# Patient Record
Sex: Male | Born: 1970 | Race: White | Hispanic: No | Marital: Single | Smoking: Never smoker
Health system: Southern US, Community
[De-identification: ages and names within clinical notes are randomized; demographics above are authoritative.]

## PROBLEM LIST (undated history)

## (undated) DIAGNOSIS — T7840XA Allergy, unspecified, initial encounter: Secondary | ICD-10-CM

## (undated) HISTORY — DX: Allergy, unspecified, initial encounter: T78.40XA

---

## 2015-04-21 ENCOUNTER — Ambulatory Visit (INDEPENDENT_AMBULATORY_CARE_PROVIDER_SITE_OTHER): Payer: BC Managed Care – PPO | Admitting: Family Medicine

## 2015-04-21 VITALS — BP 108/68 | HR 73 | Temp 98.8°F | Resp 18 | Ht 69.25 in | Wt 259.4 lb

## 2015-04-21 DIAGNOSIS — J309 Allergic rhinitis, unspecified: Secondary | ICD-10-CM | POA: Diagnosis not present

## 2015-04-21 DIAGNOSIS — J4520 Mild intermittent asthma, uncomplicated: Secondary | ICD-10-CM

## 2015-04-21 MED ORDER — BENZONATATE 100 MG PO CAPS: 100.0000 mg | ORAL_CAPSULE | Freq: Three times a day (TID) | ORAL | Status: AC | PRN

## 2015-04-21 MED ORDER — HYDROCODONE-HOMATROPINE 5-1.5 MG/5ML PO SYRP
5.0000 mL | ORAL_SOLUTION | ORAL | Status: AC | PRN
Start: 2015-04-21 — End: ?

## 2015-04-21 MED ORDER — FLUTICASONE PROPIONATE 50 MCG/ACT NA SUSP: 2.0000 | Freq: Every day | NASAL | Status: AC

## 2015-04-21 NOTE — Patient Instructions (Signed)
Always drink plenty of fluids because that will help keep the secretions thinner  Use the fluticasone nose spray 2 sprays in each nostril twice daily for about 4 days, then drop back to once daily.  Take the benzonatate cough pills one pill or 2 pills 3 times daily as needed for daytime cough  Use the Hycodan cough syrup at bedtime if needed for nighttime cough  Take an over-the-counter anti-histamine such as Claritin (loratadine) or fexofenadine (Allegra) or cetirizine (Zyrtec) one daily. If the fluticasone is not helping the nasal congestion enough and the hip pressure sensation you can try one of these allergy medications in the fashion that combines them with a decongestant (-D)  If you start bringing up a lot of purulent mucus contact me or return if needed

## 2015-04-21 NOTE — Progress Notes (Signed)
  Subjective:  Patient ID: Scott Prince, male    DOB: [birth year]  Age: 44 y.o. 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 He is bending [employer] for 2 years. When he was in [place] with his last job he did have some laboratory allergy problems. For the last several weeks he has been having headache congestion and pressure in his sinuses. He has a cough day and night. He is coughing up some white phlegm.  This is his first visit here. He is generally healthy. Not on any regular medications. He is taken some DM cough syrup.    Objective:   Pleasant gentleman, a little overweight, in no acute distress. He has a little rash on both cheeks adjacent to the nose. This is just been there over the last week. TMs are normal. Throat clear. Sinuses don't seem to be particularly tender. Neck supple without significant nodes. Chest is clear to auscultation. Heart regular without murmur.  Assessment & Plan:   Assessment: Allergic rhinosinusitis Allergic bronchitis  Plan: Patient Instructions  Always drink plenty of fluids because that will help keep the secretions thinner  Use the fluticasone nose spray 2 sprays in each nostril twice daily for about 4 days, then drop back to once daily.  Take the benzonatate cough pills one pill or 2 pills 3 times daily as needed for daytime cough  Use the Hycodan cough syrup at bedtime if needed for nighttime cough  Take an over-the-counter anti-histamine such as Claritin (loratadine) or fexofenadine (Allegra) or cetirizine (Zyrtec) one daily. If the fluticasone is not helping the nasal congestion enough and the hip pressure sensation you can try one of these allergy medications in the fashion that combines them with a decongestant (-D)  If you start bringing up a lot of purulent mucus contact me or return if needed     Dareen Gutzwiller, MD 04/21/2015

## 2016-12-16 ENCOUNTER — Encounter (HOSPITAL_COMMUNITY): Payer: Self-pay | Admitting: Emergency Medicine

## 2016-12-16 ENCOUNTER — Ambulatory Visit (INDEPENDENT_AMBULATORY_CARE_PROVIDER_SITE_OTHER): Payer: BC Managed Care – PPO

## 2016-12-16 ENCOUNTER — Ambulatory Visit (HOSPITAL_COMMUNITY)
Admission: EM | Admit: 2016-12-16 | Discharge: 2016-12-16 | Disposition: A | Discharge status: Home / Self Care | Payer: BC Managed Care – PPO | Attending: Family Medicine | Admitting: Family Medicine

## 2016-12-16 DIAGNOSIS — K5732 Diverticulitis of large intestine without perforation or abscess without bleeding: Secondary | ICD-10-CM | POA: Diagnosis not present

## 2016-12-16 DIAGNOSIS — R1030 Lower abdominal pain, unspecified: Secondary | ICD-10-CM

## 2016-12-16 LAB — POCT URINALYSIS DIP (DEVICE)
Bilirubin Urine: NEGATIVE
GLUCOSE, UA: NEGATIVE mg/dL
Hgb urine dipstick: NEGATIVE
Ketones, ur: NEGATIVE mg/dL
LEUKOCYTES UA: NEGATIVE
Nitrite: NEGATIVE
Protein, ur: 30 mg/dL — AB
Specific Gravity, Urine: >=1.03 (ref 1.005–1.030)
UROBILINOGEN UA: 0.2 mg/dL (ref 0.0–1.0)
pH: 6 (ref 5.0–8.0)

## 2016-12-16 MED ORDER — AMOXICILLIN-POT CLAVULANATE 875-125 MG PO TABS: 1.0000 | ORAL_TABLET | Freq: Two times a day (BID) | ORAL | 0 refills | Status: AC

## 2016-12-16 NOTE — ED Triage Notes (Signed)
PT reports abdominal pain that started yesterday. PT reports pain is located in LLQ. PT denies NVD. PT reports pain is 3/10 and aching in quality. PT has sharp pain before passing gas that lasts for a moment or two.

## 2016-12-16 NOTE — ED Provider Notes (Signed)
[facility]    CSN: [id number] Arrival date & time: 12/16/16  1431     History   Chief Complaint Chief Complaint  Patient presents with  . Abdominal Pain    HPI Scott Prince is a 46 y.o. male.   Is a 46 year old man who presents with abdominal pain. He notes that the pain is in his left lower quadrant and is worse when he moves. He began yesterday spontaneously in the late afternoon. He's never had this kind of pain before and characterized by an ache that becomes sharp when he coughs or sneezes.  Patient's had no fever, nausea, vomiting, or diarrhea. Last bowel movement was this morning.      Past Medical History:  Diagnosis Date  . Allergy     There are no active problems to display for this patient.   History reviewed. No pertinent surgical history.     Home Medications    Prior to Admission medications   Medication Sig Start Date End Date Taking? Authorizing Provider  amoxicillin-clavulanate (AUGMENTIN) 875-125 MG tablet Take 1 tablet by mouth every 12 (twelve) hours. 12/16/16   Elvina Sidle, MD  benzonatate (TESSALON) 100 MG capsule Take 1-2 capsules (100-200 mg total) by mouth 3 (three) times daily as needed. 04/21/15   Peyton Najjar, MD  fluticasone (FLONASE) 50 MCG/ACT nasal spray Place 2 sprays into both nostrils daily. 04/21/15   Peyton Najjar, MD  HYDROcodone-homatropine [facility]) 5-1.5 MG/5ML syrup Take 5 mLs by mouth every 4 (four) hours as needed. 04/21/15   Peyton Najjar, MD    Family History No family history on file.  Social History Social History  Substance Use Topics  . Smoking status: Never Smoker  . Smokeless tobacco: Never Used  . Alcohol use No     Comment: rarely     Allergies   Patient has no known allergies.   Review of Systems Review of Systems  Constitutional: Negative.   HENT: Negative.   Respiratory: Negative.   Cardiovascular: Negative.   Gastrointestinal: Positive for abdominal pain.  Genitourinary:  Negative.   Neurological: Negative.      Physical Exam Triage Vital Signs ED Triage Vitals  Enc Vitals Group     BP      Pulse      Resp      Temp      Temp src      SpO2      Weight      Height      Head Circumference      Peak Flow      Pain Score      Pain Loc      Pain Edu?      Excl. in GC?    No data found.   Updated Vital Signs BP 137/90   Pulse 85   Temp 98.8 F (37.1 C) (Oral)   Resp 16   Ht  (1.778 m)   Wt 250 lb (113.4 kg)   SpO2 98%   BMI 35.87 kg/m   Physical Exam  Constitutional: He is oriented to person, place, and time. He appears well-developed and well-nourished.  HENT:  Right Ear: External ear normal.  Left Ear: External ear normal.  Mouth/Throat: Oropharynx is clear and moist.  Eyes: Conjunctivae and EOM are normal. Pupils are equal, round, and reactive to light.  Neck: Normal range of motion. Neck supple.  Cardiovascular: Normal rate, regular rhythm and normal heart sounds.   Pulmonary/Chest: Effort normal  and breath sounds normal.  Abdominal: Soft. There is tenderness. There is no rebound and no guarding.  Musculoskeletal: Normal range of motion.  Neurological: He is alert and oriented to person, place, and time.  Skin: Skin is warm and dry.  Nursing note and vitals reviewed.    UC Treatments / Results  Labs (all labs ordered are listed, but only abnormal results are displayed) Labs Reviewed  POCT URINALYSIS DIP (DEVICE) - Abnormal; Notable for the following:       Result Value   Protein, ur 30 (*)    All other components within normal limits    EKG  EKG Interpretation None       Radiology Dg Abd 1 View  Result Date: 12/16/2016 CLINICAL DATA:  Left lower quadrant abdominal pain since yesterday. EXAM: ABDOMEN - 1 VIEW COMPARISON:  None. FINDINGS: Normal bowel gas pattern.  Unremarkable bones. IMPRESSION: No acute abnormality. Electronically Signed   By: Beckie Salts M.D.   On: 12/16/2016 15:18     Procedures Procedures (including critical care time)  Medications Ordered in UC Medications - No data to display   Initial Impression / Assessment and Plan / UC Course  I have reviewed the triage vital signs and the nursing notes.  Pertinent labs & imaging results that were available during my care of the patient were reviewed by me and considered in my medical decision making (see chart for details).     Final Clinical Impressions(s) / UC Diagnoses   Final diagnoses:  Lower abdominal pain  Diverticulitis of large intestine without perforation or abscess without bleeding    New Prescriptions New Prescriptions   AMOXICILLIN-CLAVULANATE (AUGMENTIN) 875-125 MG TABLET    Take 1 tablet by mouth every 12 (twelve) hours.     Elvina Sidle, MD 12/16/16 [phone]

## 2018-08-27 IMAGING — DX DG ABDOMEN 1V
2 series · 2 of 2 positions shown · non-contrast
Comparison: None.

CLINICAL DATA: Left lower quadrant abdominal pain since yesterday.

EXAM:
ABDOMEN - 1 VIEW

[abdomen kub (1 of 2)]
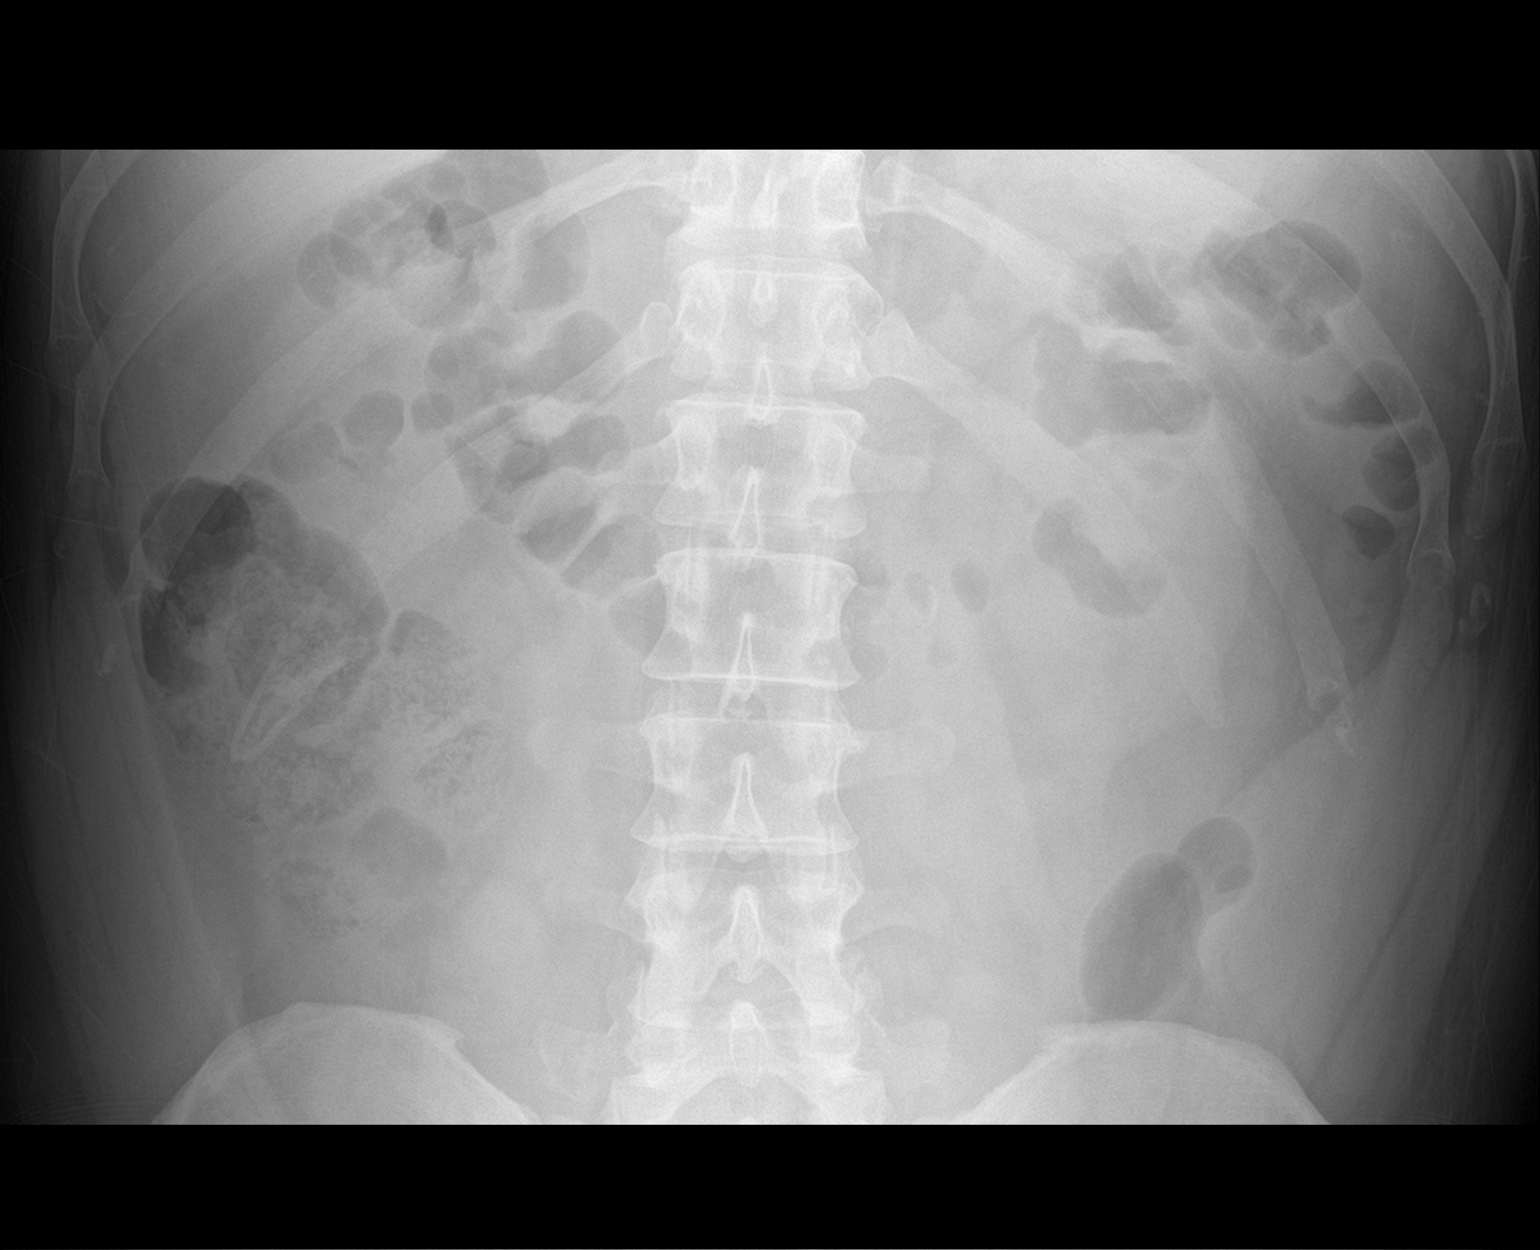

[abdomen kub (2 of 2)]
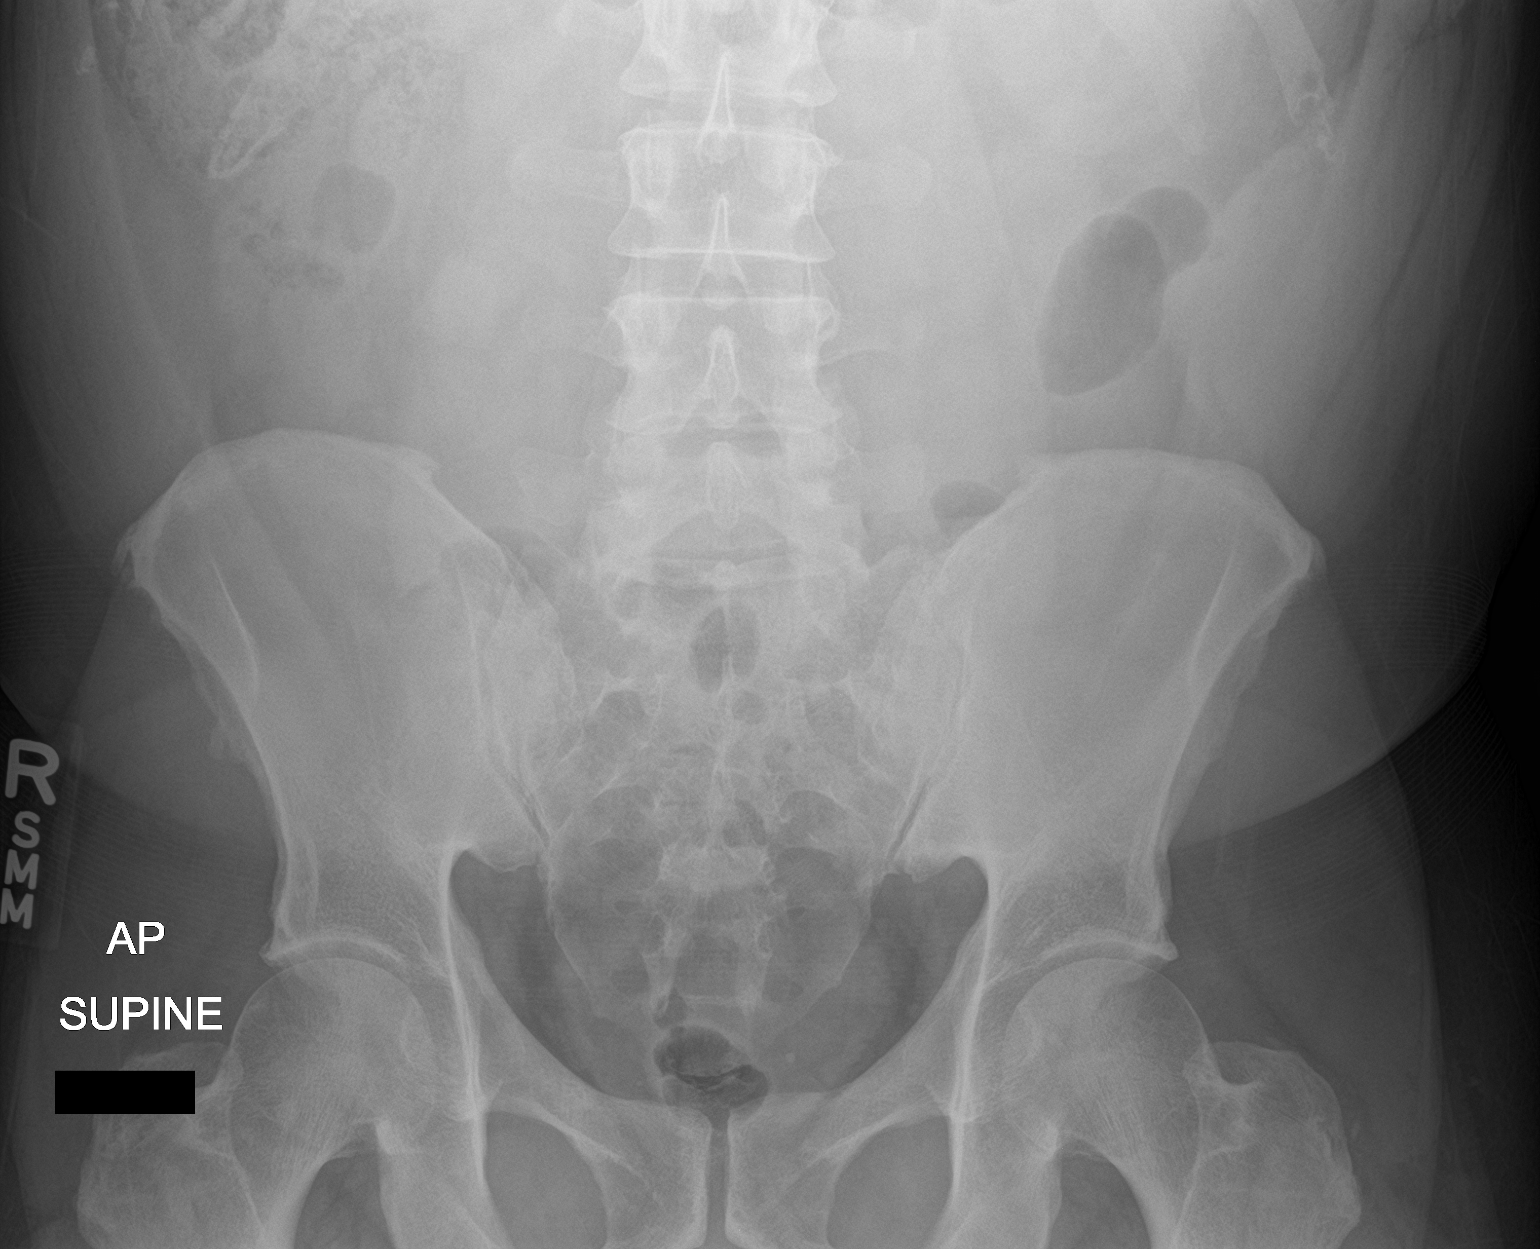

[2 of 2 positions shown; findings below may reference images not displayed]

FINDINGS: Normal bowel gas pattern.  Unremarkable bones.
IMPRESSION: No acute abnormality.

## 2018-10-28 ENCOUNTER — Encounter (HOSPITAL_COMMUNITY): Payer: Self-pay

## 2018-10-28 ENCOUNTER — Emergency Department (HOSPITAL_COMMUNITY): Payer: BC Managed Care – PPO

## 2018-10-28 ENCOUNTER — Emergency Department (HOSPITAL_COMMUNITY)
Admission: EM | Admit: 2018-10-28 | Discharge: 2018-10-28 | Disposition: A | Discharge status: Home / Self Care | Payer: BC Managed Care – PPO | Attending: Emergency Medicine | Admitting: Emergency Medicine

## 2018-10-28 ENCOUNTER — Other Ambulatory Visit: Payer: Self-pay

## 2018-10-28 DIAGNOSIS — R0789 Other chest pain: Secondary | ICD-10-CM | POA: Diagnosis present

## 2018-10-28 DIAGNOSIS — Z79899 Other long term (current) drug therapy: Secondary | ICD-10-CM | POA: Insufficient documentation

## 2018-10-28 LAB — CBC
HEMATOCRIT: 47.7 % (ref 39.0–52.0)
HEMOGLOBIN: 15.9 g/dL (ref 13.0–17.0)
MCH: 30.1 pg (ref 26.0–34.0)
MCHC: 33.3 g/dL (ref 30.0–36.0)
MCV: 90.3 fL (ref 80.0–100.0)
Platelets: 220 10*3/uL (ref 150–400)
RBC: 5.28 MIL/uL (ref 4.22–5.81)
RDW: 12.5 % (ref 11.5–15.5)
WBC: 12.4 10*3/uL — ABNORMAL HIGH (ref 4.0–10.5)
nRBC: 0 % (ref 0.0–0.2)

## 2018-10-28 LAB — I-STAT TROPONIN, ED: Troponin i, poc: 0 ng/mL (ref 0.00–0.08)

## 2018-10-28 LAB — POCT I-STAT TROPONIN I: Troponin i, poc: 0 ng/mL (ref 0.00–0.08)

## 2018-10-28 LAB — BASIC METABOLIC PANEL
Anion gap: 7 (ref 5–15)
BUN: 23 mg/dL — AB (ref 6–20)
CO2: 28 mmol/L (ref 22–32)
Calcium: 8.6 mg/dL — ABNORMAL LOW (ref 8.9–10.3)
Chloride: 103 mmol/L (ref 98–111)
Creatinine, Ser: 1.22 mg/dL (ref 0.61–1.24)
GFR calc Af Amer: >60 mL/min (ref >60)
GFR calc non Af Amer: >60 mL/min (ref >60)
Glucose, Bld: 105 mg/dL — ABNORMAL HIGH (ref 70–99)
POTASSIUM: 4 mmol/L (ref 3.5–5.1)
Sodium: 138 mmol/L (ref 135–145)

## 2018-10-28 NOTE — ED Provider Notes (Signed)
[facility]EMERGENCY [facility]PT Provider Note   CSN: [id number] Arrival date & time: 10/28/18  1511     History   Chief Complaint Chief Complaint  Patient presents with  . Chest Pain    HPI Scott Prince is a 48 y.o. male.  The history is provided by the patient.  Chest Pain  Pain location:  Substernal area Pain quality: dull   Pain radiates to:  Does not radiate Pain severity:  Mild Onset quality:  Gradual Timing:  Intermittent Progression:  Waxing and waning Chronicity:  New Context: at rest   Relieved by:  Nothing Worsened by:  Nothing Associated symptoms: anxiety   Associated symptoms: no abdominal pain, no anorexia, no back pain, no cough, no diaphoresis, no dizziness, no dysphagia, no fatigue, no fever, no headache, no heartburn, no lower extremity edema, no numbness, no orthopnea, no palpitations, no PND, no shortness of breath, no syncope, no vomiting and no weakness   Risk factors: no coronary artery disease, no diabetes mellitus, no high cholesterol, no immobilization, not male, no prior DVT/PE and no smoking     Past Medical History:  Diagnosis Date  . Allergy     There are no active problems to display for this patient.   History reviewed. No pertinent surgical history.      Home Medications    Prior to Admission medications   Medication Sig Start Date End Date Taking? Authorizing Provider  amoxicillin (AMOXIL) 875 MG tablet Take 875 mg by mouth 2 (two) times daily. 10/22/18  Yes [provider]  pseudoephedrine-acetaminophen (TYLENOL SINUS) 30-500 MG TABS tablet Take 2 tablets by mouth every 4 (four) hours as needed (headache, achy, congestion).   Yes [provider]  amoxicillin-clavulanate (AUGMENTIN) 875-125 MG tablet Take 1 tablet by mouth every 12 (twelve) hours. Patient not taking: Reported on 10/28/2018 12/16/16   Elvina SidleLauenstein, Kurt, MD  benzonatate (TESSALON) 100 MG capsule Take 1-2 capsules (100-200 mg total) by  mouth 3 (three) times daily as needed. Patient not taking: Reported on 10/28/2018 04/21/15   Peyton NajjarHopper, David H, MD  fluticasone [facility](FLONASE) 50 MCG/ACT nasal spray Place 2 sprays into both nostrils daily. Patient not taking: Reported on 10/28/2018 04/21/15   Peyton NajjarHopper, David H, MD  HYDROcodone-homatropine [facility](HYCODAN) 5-1.5 MG/5ML syrup Take 5 mLs by mouth every 4 (four) hours as needed. Patient not taking: Reported on 10/28/2018 04/21/15   Peyton NajjarHopper, David H, MD    Family History No family history on file.  Social History Social History   Tobacco Use  . Smoking status: Never Smoker  . Smokeless tobacco: Never Used  Substance Use Topics  . Alcohol use: No    Alcohol/week: 0.0 standard drinks    Comment: rarely  . Drug use: No     Allergies   Patient has no known allergies.   Review of Systems Review of Systems  Constitutional: Negative for chills, diaphoresis, fatigue and fever.  HENT: Negative for ear pain, sore throat and trouble swallowing.   Eyes: Negative for pain and visual disturbance.  Respiratory: Negative for cough and shortness of breath.   Cardiovascular: Positive for chest pain. Negative for palpitations, orthopnea, syncope and PND.  Gastrointestinal: Negative for abdominal pain, anorexia, heartburn and vomiting.  Genitourinary: Negative for dysuria and hematuria.  Musculoskeletal: Negative for arthralgias and back pain.  Skin: Negative for color change and rash.  Neurological: Negative for dizziness, seizures, syncope, weakness, numbness and headaches.  All other systems reviewed and are negative.    Physical Exam  Updated Vital Signs BP 131/87 (BP Location: Right Arm)   Pulse 80   Temp 98.6 F (37 C) (Oral)   Resp 16   Ht 5\' 10"  (1.778 m)   Wt 113.4 kg   SpO2 98%   BMI 35.87 kg/m   Physical Exam Vitals signs and nursing note reviewed.  Constitutional:      Appearance: He is well-developed.  HENT:     Head: Normocephalic and atraumatic.  Eyes:     Extraocular  Movements: Extraocular movements intact.     Conjunctiva/sclera: Conjunctivae normal.     Pupils: Pupils are equal, round, and reactive to light.  Neck:     Musculoskeletal: Normal range of motion and neck supple.  Cardiovascular:     Rate and Rhythm: Normal rate and regular rhythm.     Pulses:          Radial pulses are 2+ on the right side and 2+ on the left side.       Dorsalis pedis pulses are 2+ on the right side and 2+ on the left side.     Heart sounds: Normal heart sounds. No murmur.  Pulmonary:     Effort: Pulmonary effort is normal. No respiratory distress.     Breath sounds: Normal breath sounds. No decreased breath sounds, wheezing, rhonchi or rales.  Abdominal:     Palpations: Abdomen is soft.     Tenderness: There is no abdominal tenderness.  Musculoskeletal: Normal range of motion.     Right lower leg: No edema.     Left lower leg: No edema.  Skin:    General: Skin is warm and dry.     Capillary Refill: Capillary refill takes less than 2 seconds.  Neurological:     General: No focal deficit present.     Mental Status: He is alert.      ED Treatments / Results  Labs (all labs ordered are listed, but only abnormal results are displayed) Labs Reviewed  BASIC METABOLIC PANEL - Abnormal; Notable for the following components:      Result Value   Glucose, Bld 105 (*)    BUN 23 (*)    Calcium 8.6 (*)    All other components within normal limits  CBC - Abnormal; Notable for the following components:   WBC 12.4 (*)    All other components within normal limits  I-STAT TROPONIN, ED  I-STAT TROPONIN, ED  POCT I-STAT TROPONIN I    EKG EKG Interpretation  Date/Time:  Tuesday October 28 2018 15:15:45 EST Ventricular Rate:  85 PR Interval:    QRS Duration: 85 QT Interval:  372 QTC Calculation: 443 R Axis:   73 Text Interpretation:  Sinus rhythm Confirmed by Virgina NorfolkAdam, Shalika Arntz [phone]([id number]) on 10/28/2018 7:21:21 PM   Radiology Dg Chest 2 View  Result Date:  10/28/2018 CLINICAL DATA:  Central chest pain beginning today. EXAM: CHEST - 2 VIEW COMPARISON:  None. FINDINGS: Heart size is normal. Mediastinal shadows are normal. The lungs are clear. No bronchial thickening. No infiltrate, mass, effusion or collapse. Pulmonary vascularity is normal. No bony abnormality. IMPRESSION: Normal chest Electronically Signed   By: Paulina FusiMark  Shogry M.D.   On: 10/28/2018 16:10    Procedures Procedures (including critical care time)  Medications Ordered in ED Medications - No data to display   Initial Impression / Assessment and Plan / ED Course  I have reviewed the triage vital signs and the nursing notes.  Pertinent labs & imaging results that were available  during my care of the patient were reviewed by me and considered in my medical decision making (see chart for details).     Muriel Ohm is a 48 year old male with no significant medical history who presents to the ED with chest pain.  Patient with normal vitals.  No fever.  Patient with mild substernal chest pain for the last several hours.  Possibly mild burning in his chest.  Has family history of heart disease but has no cardiac risk factors himself.  EKG shows normal sinus rhythm.  No ischemic changes.  Patient with no signs of volume overload on exam.  No cough, no sputum production.  Chest x-ray showed no signs of pneumonia, pneumothorax, pleural effusion.  Heart score is 1.  2 troponins were within normal limits.  Doubt ACS.  Patient is PERC negative and doubt PE. H and P not consistent with dissection as normal pulses, no widen mediastinum, no severe pain.  Patient had no significant leukocytosis, anemia, electrolyte abnormality.  Overall suspect component of stress versus reflux.  However, would encourage close follow-up with primary care doctor for outpatient stress test given his family history of heart disease.  Overall story is atypical and patient was discharged from ED in good condition.  Given return  precautions.  This chart was dictated using voice recognition software.  Despite best efforts to proofread,  errors can occur which can change the documentation meaning.    Final Clinical Impressions(s) / ED Diagnoses   Final diagnoses:  Atypical chest pain    ED Discharge Orders    None       Virgina Norfolk, DO 10/28/18 2052

## 2018-10-28 NOTE — ED Triage Notes (Signed)
Pt c/o central chest tightness for the past two hours. Pt currently c/o chest pain 3/10.

## 2018-10-28 NOTE — Discharge Instructions (Addendum)
Discuss stress test with PCP.

## 2019-12-03 ENCOUNTER — Ambulatory Visit: Payer: BC Managed Care – PPO

## 2019-12-03 ENCOUNTER — Ambulatory Visit: Payer: BC Managed Care – PPO | Attending: Internal Medicine

## 2019-12-03 DIAGNOSIS — Z23 Encounter for immunization: Secondary | ICD-10-CM

## 2019-12-03 NOTE — Progress Notes (Signed)
   Covid-19 Vaccination Clinic  Name:  Scott Prince    MRN: [id number] DOB: 1971/06/29  12/03/2019  [name] was observed post Covid-19 immunization for 15 minutes without incident. He was provided with Vaccine Information Sheet and instruction to access the V-Safe system.   [name] was instructed to call 911 with any severe reactions post vaccine: Marland Kitchen Difficulty breathing  . Swelling of face and throat  . A fast heartbeat  . A bad rash all over body  . Dizziness and weakness   Immunizations Administered    Name Date Dose VIS Date Route   Pfizer COVID-19 Vaccine 12/03/2019  9:31 AM 0.3 mL 08/28/2019 Intramuscular   Manufacturer: [employer], [employer]   Lot: [id number]   NDC: [address]-8

## 2019-12-28 ENCOUNTER — Ambulatory Visit: Payer: BC Managed Care – PPO | Attending: Internal Medicine

## 2019-12-28 DIAGNOSIS — Z23 Encounter for immunization: Secondary | ICD-10-CM

## 2019-12-28 NOTE — Progress Notes (Signed)
   Covid-19 Vaccination Clinic  Name:  Scott Prince    MRN: [id number] DOB: [birth year]  12/28/2019  Mr. Scott Prince was observed post Covid-19 immunization for 15 minutes without incident. He was provided with Vaccine Information Sheet and instruction to access the V-Safe system.   Mr. Scott Prince was instructed to call 911 with any severe reactions post vaccine: Marland Kitchen Difficulty breathing  . Swelling of face and throat  . A fast heartbeat  . A bad rash all over body  . Dizziness and weakness   Immunizations Administered    Name Date Dose VIS Date Route   Pfizer COVID-19 Vaccine 12/28/2019 10:10 AM 0.3 mL 08/28/2019 Intramuscular   Manufacturer: [employer], [employer]   Lot: [id number]   NDC: [address]-8

## 2020-07-08 IMAGING — CR DG CHEST 2V
2 series · 2 of 2 positions shown · non-contrast
Comparison: None.

CLINICAL DATA: Central chest pain beginning today.

EXAM:
CHEST - 2 VIEW

[w chest pa]
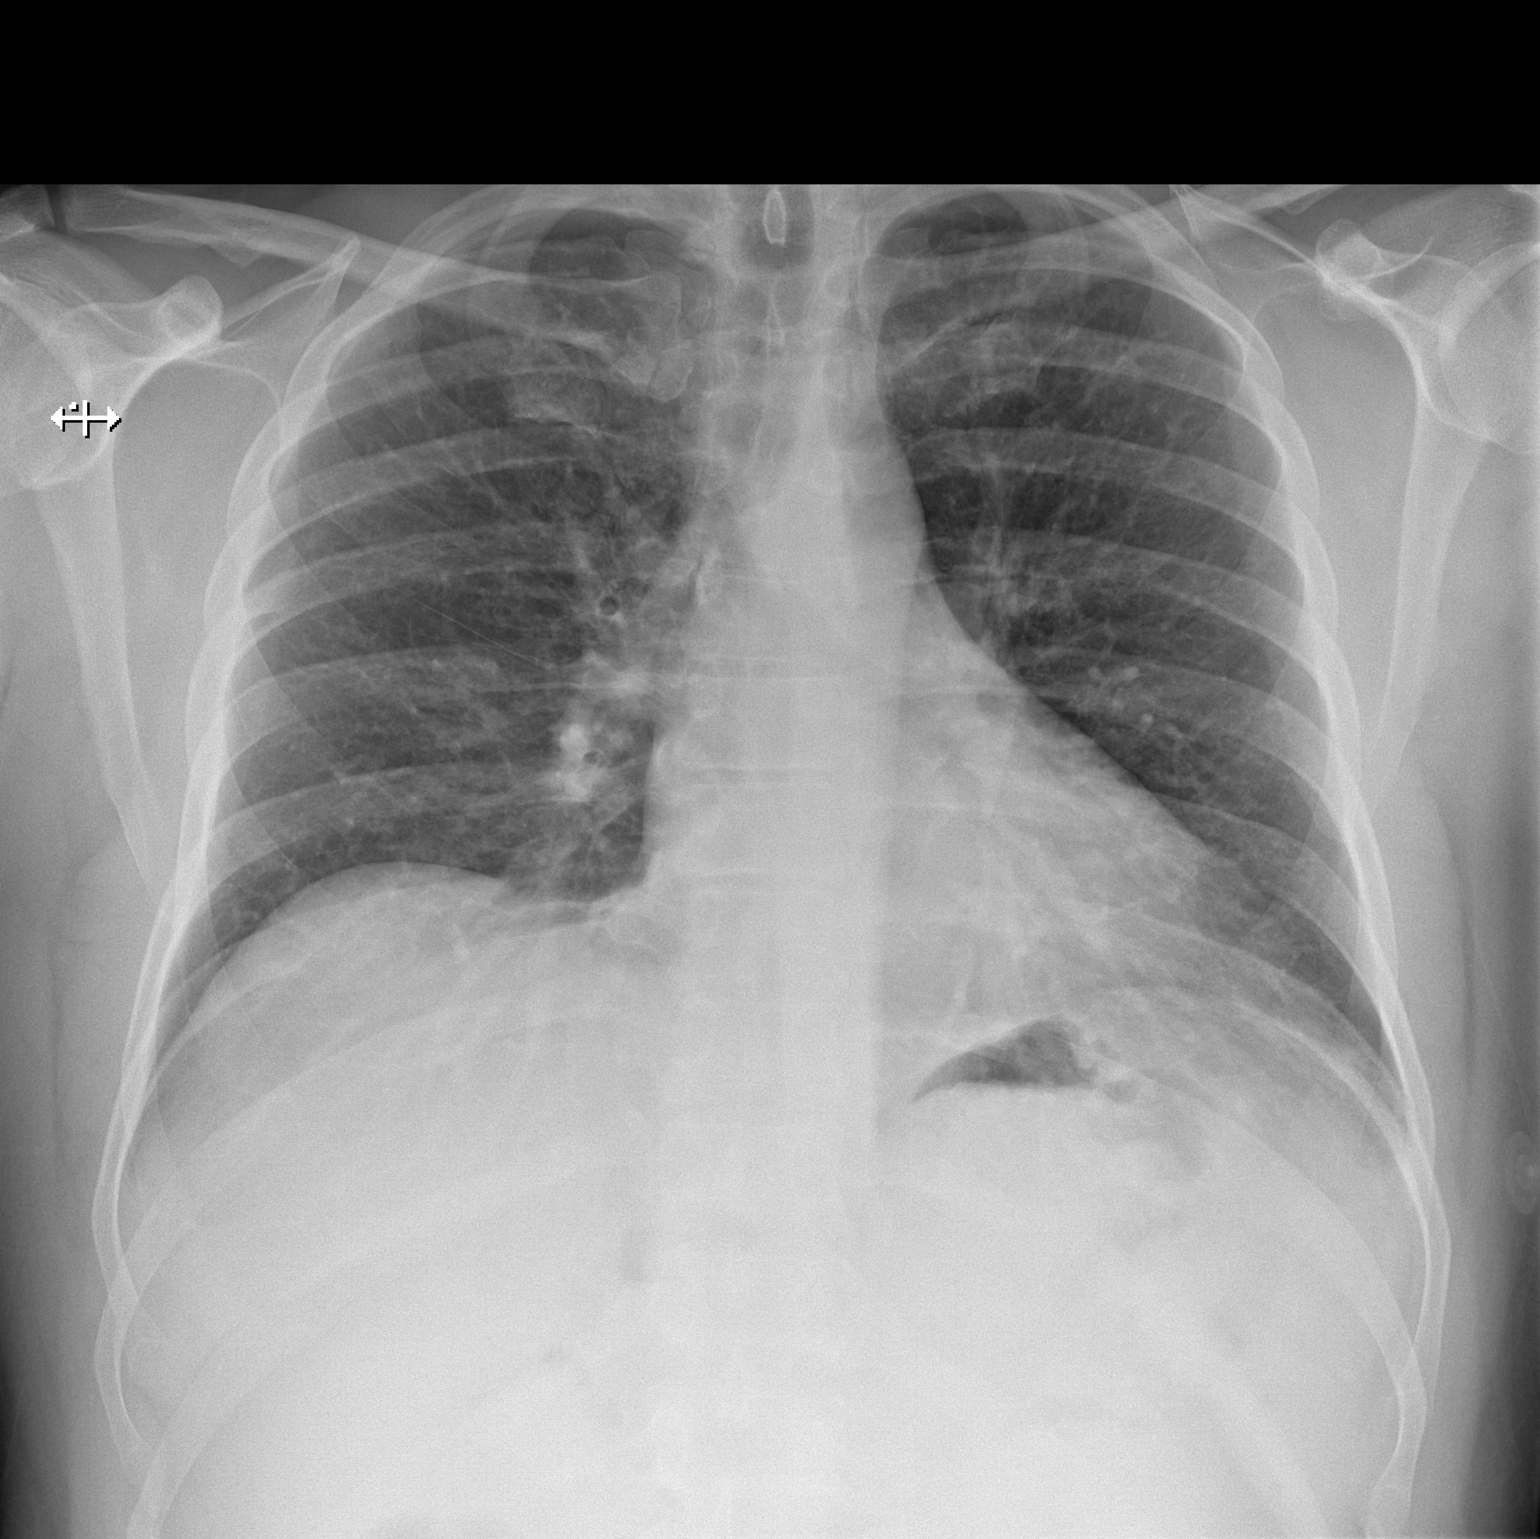

[w chest lat]
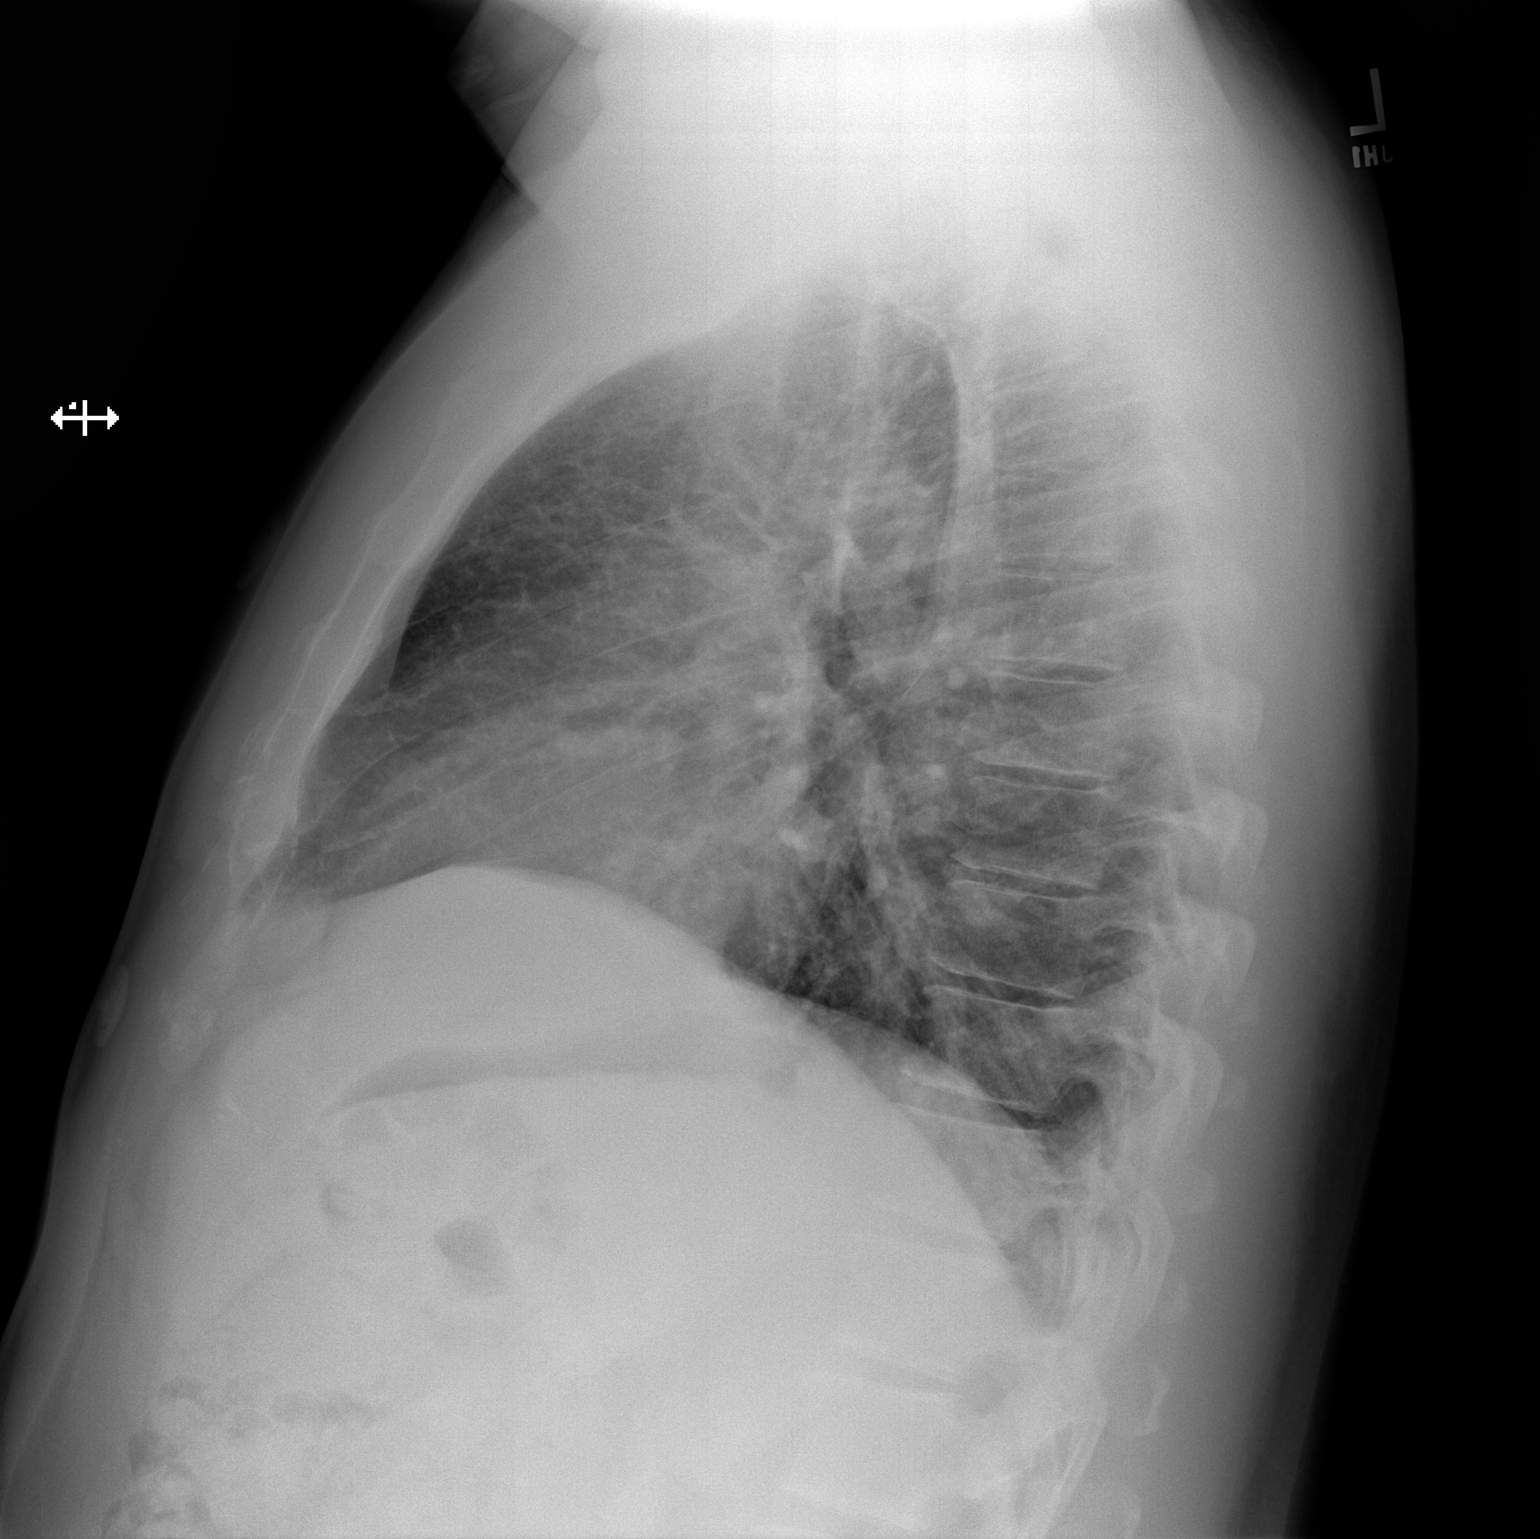

[2 of 2 positions shown; findings below may reference images not displayed]

FINDINGS: Heart size is normal. Mediastinal shadows are normal. The lungs are
clear. No bronchial thickening. No infiltrate, mass, effusion or
collapse. Pulmonary vascularity is normal. No bony abnormality.
IMPRESSION: Normal chest
